# Patient Record
Sex: Male | Born: 1995 | Race: Black or African American | Hispanic: No | Marital: Single | Smoking: Current every day smoker
Health system: Southern US, Community
[De-identification: ages and names within clinical notes are randomized; demographics above are authoritative.]

## PROBLEM LIST (undated history)

## (undated) DIAGNOSIS — J45909 Unspecified asthma, uncomplicated: Secondary | ICD-10-CM

## (undated) DIAGNOSIS — J309 Allergic rhinitis, unspecified: Secondary | ICD-10-CM

## (undated) HISTORY — DX: Allergic rhinitis, unspecified: J30.9

## (undated) HISTORY — DX: Unspecified asthma, uncomplicated: J45.909

---
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
## 2017-03-21 ENCOUNTER — Encounter (HOSPITAL_BASED_OUTPATIENT_CLINIC_OR_DEPARTMENT_OTHER): Payer: Self-pay

## 2017-03-21 ENCOUNTER — Emergency Department (HOSPITAL_BASED_OUTPATIENT_CLINIC_OR_DEPARTMENT_OTHER)
Admission: EM | Admit: 2017-03-21 | Discharge: 2017-03-21 | Disposition: A | Discharge status: Home / Self Care | Payer: Self-pay | Attending: Emergency Medicine | Admitting: Emergency Medicine

## 2017-03-21 ENCOUNTER — Other Ambulatory Visit: Payer: Self-pay

## 2017-03-21 DIAGNOSIS — R059 Cough, unspecified: Secondary | ICD-10-CM

## 2017-03-21 DIAGNOSIS — Z79899 Other long term (current) drug therapy: Secondary | ICD-10-CM | POA: Insufficient documentation

## 2017-03-21 DIAGNOSIS — R112 Nausea with vomiting, unspecified: Secondary | ICD-10-CM | POA: Insufficient documentation

## 2017-03-21 DIAGNOSIS — F17210 Nicotine dependence, cigarettes, uncomplicated: Secondary | ICD-10-CM | POA: Insufficient documentation

## 2017-03-21 DIAGNOSIS — J45909 Unspecified asthma, uncomplicated: Secondary | ICD-10-CM | POA: Insufficient documentation

## 2017-03-21 DIAGNOSIS — R05 Cough: Secondary | ICD-10-CM

## 2017-03-21 MED ORDER — BENZONATATE 100 MG PO CAPS
200.0000 mg | ORAL_CAPSULE | Freq: Once | ORAL | Status: AC
Start: 2017-03-21 — End: 2017-03-21
  Administered 2017-03-21: 200 mg via ORAL
  Filled 2017-03-21: qty 2

## 2017-03-21 MED ORDER — ONDANSETRON 8 MG PO TBDP
8.0000 mg | ORAL_TABLET | Freq: Once | ORAL | Status: AC
  Administered 2017-03-21: 8 mg via ORAL
  Filled 2017-03-21: qty 1

## 2017-03-21 MED ORDER — ALBUTEROL SULFATE HFA 108 (90 BASE) MCG/ACT IN AERS: 2.0000 | INHALATION_SPRAY | Freq: Once | RESPIRATORY_TRACT | Status: DC

## 2017-03-21 MED ORDER — ONDANSETRON 8 MG PO TBDP: ORAL_TABLET | ORAL | 0 refills | Status: DC

## 2017-03-21 MED ORDER — BENZONATATE 100 MG PO CAPS: 100.0000 mg | ORAL_CAPSULE | Freq: Three times a day (TID) | ORAL | 0 refills | Status: DC

## 2017-03-21 NOTE — ED Provider Notes (Signed)
[facility] EMERGENCY DEPARTMENT Provider Note   CSN: [id number] Arrival date & time: 03/21/17  [id number]     History   Chief Complaint Chief Complaint  Patient presents with  . Cough    HPI Hunter Burke is a 22 y.o. male.  The history is provided by the patient.  Cough  This is a recurrent problem. The current episode started yesterday. The problem occurs every few hours. The problem has not changed since onset.The cough is non-productive. There has been no fever. Pertinent negatives include no chest pain, no chills, no sore throat, no myalgias, no shortness of breath and no wheezing. He has tried nothing for the symptoms. The treatment provided no relief. His past medical history does not include pneumonia.  Also has nausea and vomiting.    Past Medical History:  Diagnosis Date  . Allergic rhinitis   . Asthma     Patient Active Problem List   Diagnosis Date Noted  . Mild persistent asthma 07/04/2016  . Allergic cough 07/04/2016  . Seasonal and perennial allergic rhinoconjunctivitis 07/04/2016  . Seasonal allergic conjunctivitis 07/04/2016    History reviewed. No pertinent surgical history.     Home Medications    Prior to Admission medications   Medication Sig Start Date End Date Taking? Authorizing Provider  cetirizine (ZYRTEC) 10 MG tablet Take 1 tablet (10 mg total) by mouth daily as needed for allergies. 07/04/16  Yes Bobbitt, Heywood Ilesalph Carter, MD  EPINEPHrine (EPIPEN 2-PAK) 0.3 mg/0.3 mL IJ SOAJ injection Use as directed for severe allergic reactions 07/04/16  Yes Bobbitt, Heywood Ilesalph Carter, MD  fluticasone [facility](FLONASE) 50 MCG/ACT nasal spray Place 2 sprays into both nostrils daily as needed for allergies or rhinitis. 07/04/16  Yes Bobbitt, Heywood Ilesalph Carter, MD  fluticasone (FLOVENT HFA) 110 MCG/ACT inhaler Inhale 2 puffs into the lungs 2 (two) times daily. Patient taking differently: Inhale 2 puffs into the lungs at bedtime.  07/04/16  Yes Bobbitt, Heywood Ilesalph Carter, MD    NON FORMULARY 2 injections weekly   Yes [provider]  olopatadine (PATANOL) 0.1 % ophthalmic solution Place 1 drop into both eyes 2 (two) times daily as needed for allergies. 07/04/16  Yes Bobbitt, Heywood Ilesalph Carter, MD  albuterol (PROVENTIL HFA) 108 (90 Base) MCG/ACT inhaler Inhale 2 puffs into the lungs every 4 (four) hours as needed for wheezing or shortness of breath. 07/04/16   Bobbitt, Heywood Ilesalph Carter, MD  benzonatate (TESSALON) 100 MG capsule Take 1 capsule (100 mg total) by mouth every 8 (eight) hours. 03/21/17   Deyton Ellenbecker, MD  ondansetron (ZOFRAN ODT) 8 MG disintegrating tablet 8mg  ODT q12  hours prn nausea 03/21/17   Rayyan Orsborn, MD    Family History Family History  Problem Relation Age of Onset  . Allergic rhinitis Sister   . Eczema Sister   . Asthma Paternal Uncle   . Urticaria Neg Hx   . Immunodeficiency Neg Hx   . Angioedema Neg Hx   . Atopy Neg Hx     Social History Social History   Tobacco Use  . Smoking status: Current Every Day Smoker    Types: Cigarettes  . Smokeless tobacco: Never Used  . Tobacco comment: half of one cig a day, black and mild  Substance Use Topics  . Alcohol use: No  . Drug use: Yes    Types: Marijuana     Allergies   Patient has no known allergies.   Review of Systems Review of Systems  Constitutional: Negative for chills and  fever.  HENT: Positive for congestion. Negative for ear discharge, facial swelling, sore throat and trouble swallowing.   Eyes: Negative for photophobia.  Respiratory: Positive for cough. Negative for shortness of breath and wheezing.   Cardiovascular: Negative for chest pain.  Gastrointestinal: Positive for nausea and vomiting. Negative for abdominal pain.  Genitourinary: Negative for flank pain.  Musculoskeletal: Negative for myalgias.  Neurological: Negative for weakness.  All other systems reviewed and are negative.    Physical Exam Updated Vital Signs BP 111/83 (BP Location: Right Arm)    Pulse 82   Temp 97.6 F (36.4 C) (Oral)   Resp 18   Ht 5\' 9"  (1.753 m)   Wt 65.8 kg (145 lb)   SpO2 99%   BMI 21.41 kg/m   Physical Exam  Constitutional: He is oriented to person, place, and time. He appears well-developed and well-nourished. No distress.  HENT:  Head: Normocephalic and atraumatic.  Nose: Nose normal.  Mouth/Throat: Oropharynx is clear and moist. No oropharyngeal exudate.  Eyes: Conjunctivae are normal. Pupils are equal, round, and reactive to light.  Neck: Normal range of motion. Neck supple.  Cardiovascular: Normal rate, regular rhythm, normal heart sounds and intact distal pulses.  Pulmonary/Chest: Effort normal and breath sounds normal. No stridor. No respiratory distress. He has no wheezes. He has no rales.  Abdominal: Soft. Bowel sounds are normal. He exhibits no mass. There is no tenderness. There is no rebound and no guarding. No hernia.  Musculoskeletal: Normal range of motion.  Neurological: He is alert and oriented to person, place, and time. He displays normal reflexes.  Skin: Skin is warm and dry. Capillary refill takes less than 2 seconds.  Psychiatric: He has a normal mood and affect.     ED Treatments / Results   Vitals:   03/21/17 0426  BP: 111/83  Pulse: 82  Resp: 18  Temp: 97.6 F (36.4 C)  SpO2: 99%    Procedures Procedures (including critical care time)  Medications Ordered in ED Medications  ondansetron (ZOFRAN-ODT) disintegrating tablet 8 mg (8 mg Oral Given 03/21/17 0433)  benzonatate (TESSALON) capsule 200 mg (200 mg Oral Given 03/21/17 0433)       Final Clinical Impressions(s) / ED Diagnoses   Final diagnoses:  Non-intractable vomiting with nausea, unspecified vomiting type  Cough   Return for weakness, numbness, changes in vision or speech, fevers >100.4 unrelieved by medication, shortness of breath, intractable vomiting, or diarrhea, abdominal pain, Inability to tolerate liquids or food, cough, altered mental  status or any concerns. No signs of systemic illness or infection. The patient is nontoxic-appearing on exam and vital signs are within normal limits.   I have reviewed the triage vital signs and the nursing notes. Pertinent labs &imaging results that were available during my care of the patient were reviewed by me and considered in my medical decision making (see chart for details).  After history, exam, and medical workup I feel the patient has been appropriately medically screened and is safe for discharge home. Pertinent diagnoses were discussed with the patient. Patient was given return precautions.  ED Discharge Orders        Ordered    ondansetron (ZOFRAN ODT) 8 MG disintegrating tablet     03/21/17 0438    benzonatate (TESSALON) 100 MG capsule  Every 8 hours     03/21/17 0438       Ramello Cordial, MD 03/21/17 0440

## 2017-03-21 NOTE — ED Notes (Signed)
ED Provider at bedside. 

## 2017-03-21 NOTE — ED Triage Notes (Signed)
Pt presents from home with a cough and post-tussive emesis.

## 2018-03-03 IMAGING — CR DG CHEST 2V
2 series · 2 of 2 positions shown · non-contrast
Comparison: None.

CLINICAL DATA: Cough, vomiting and chest pain today

EXAM:
CHEST  2 VIEW

[w chest pa]
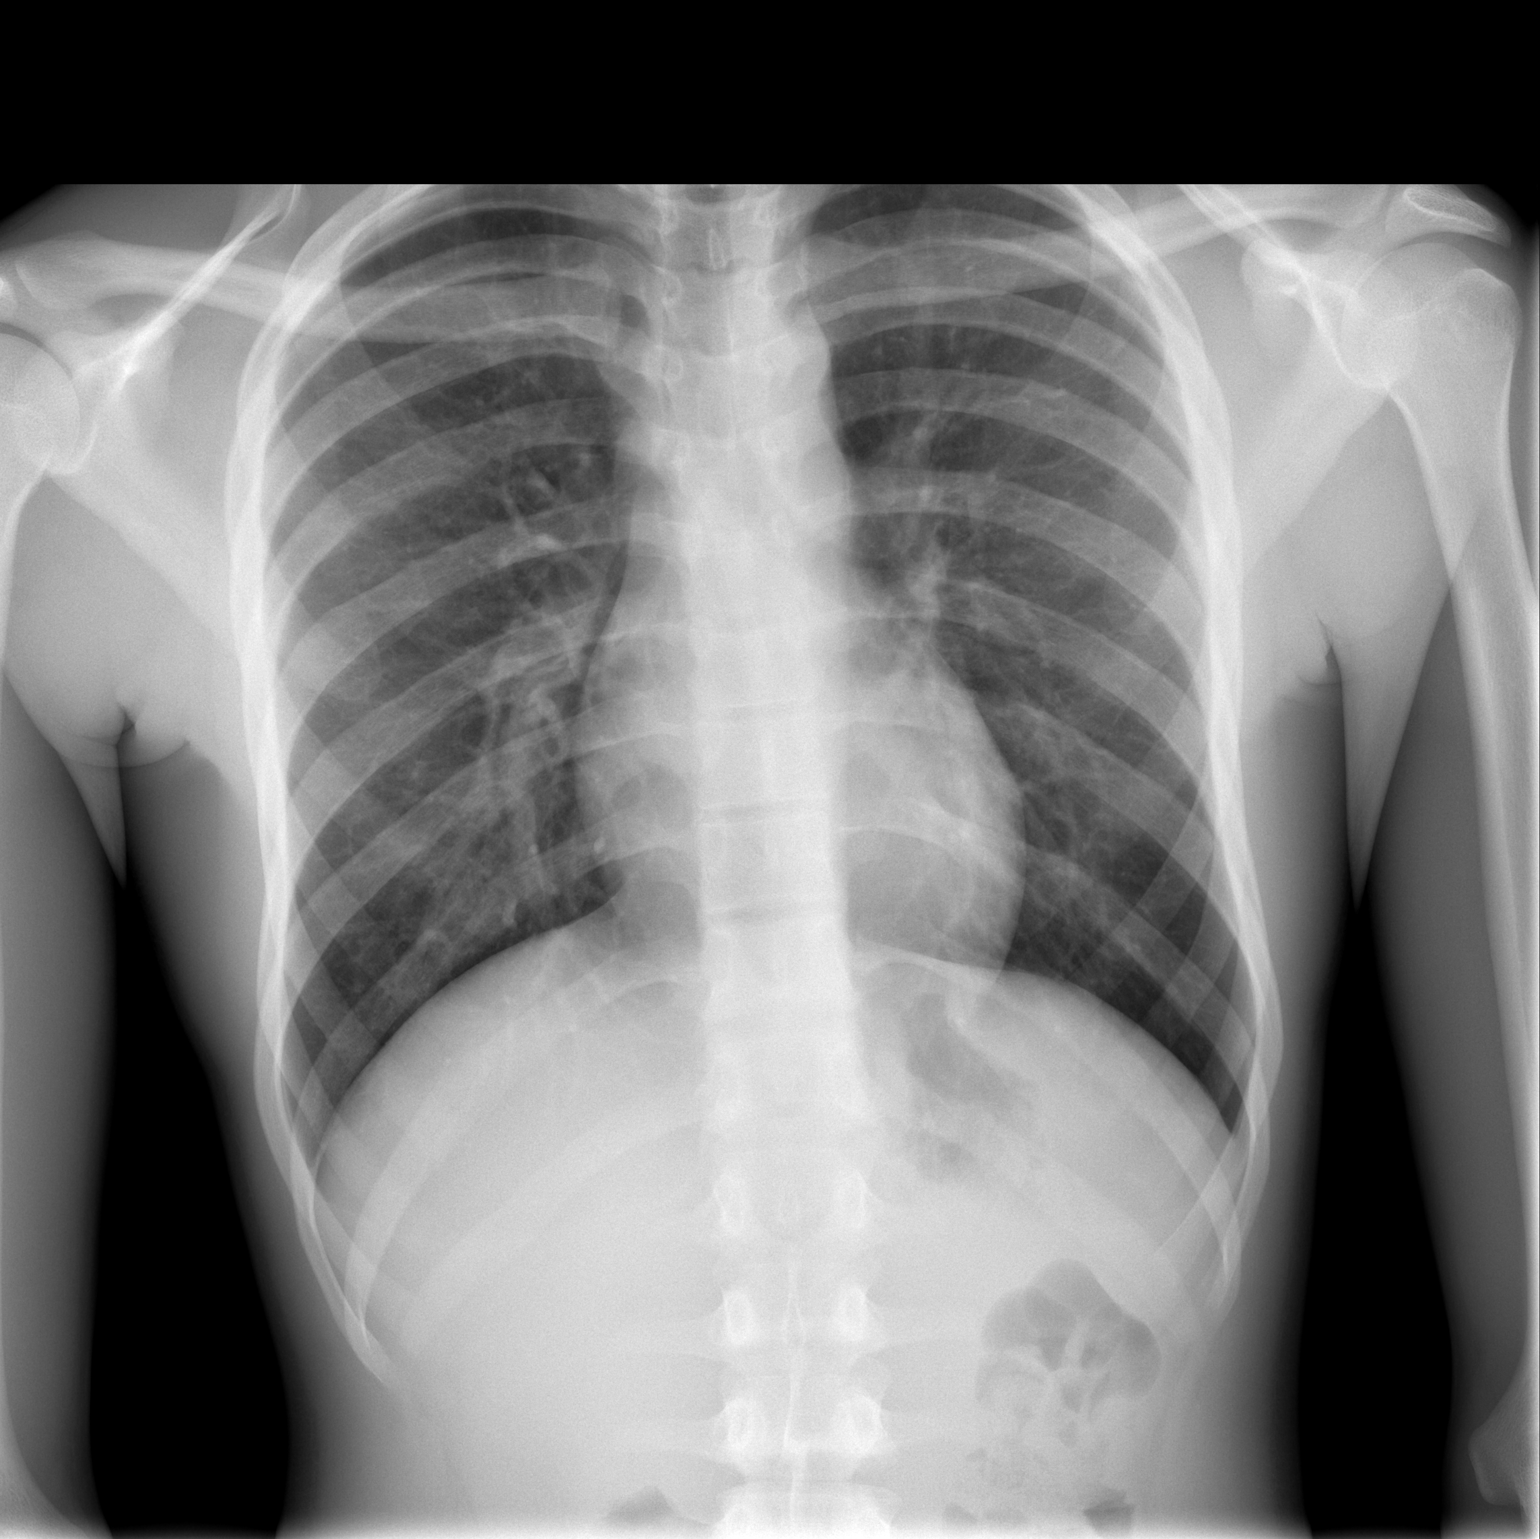

[w chest lat]
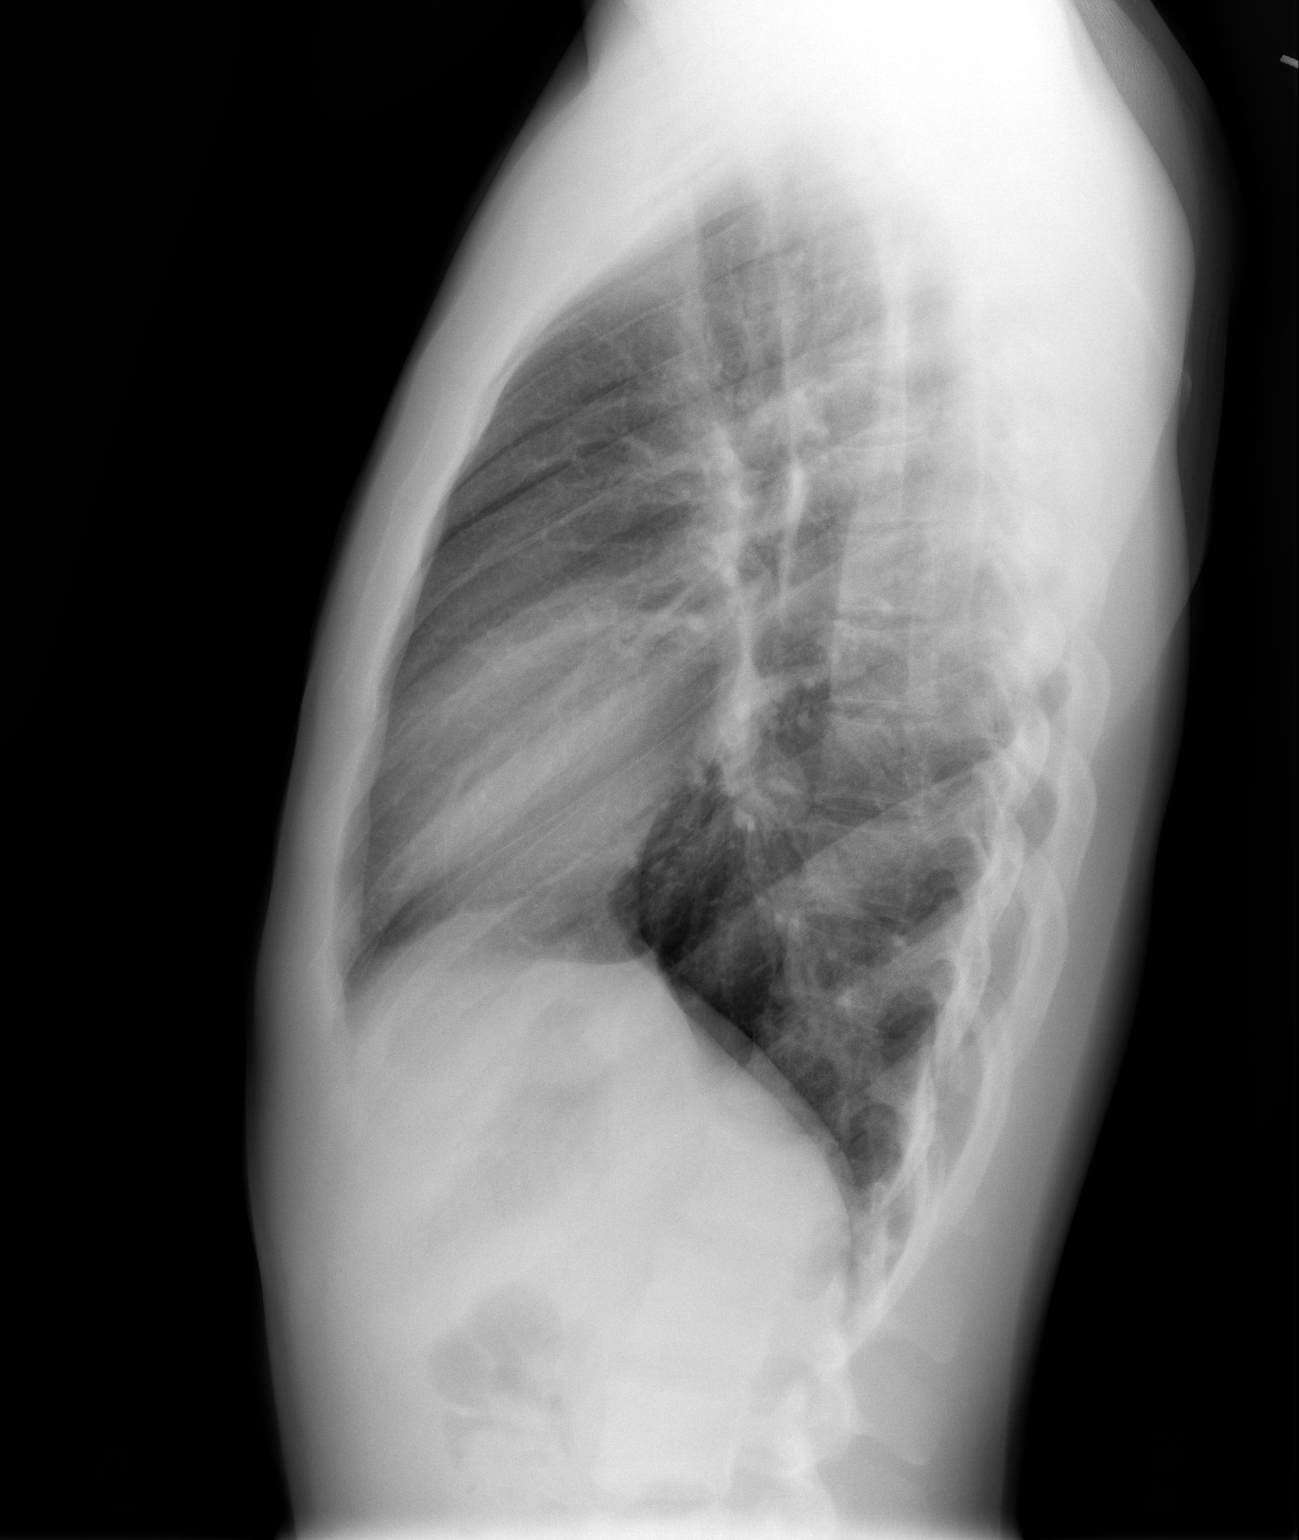

[2 of 2 positions shown; findings below may reference images not displayed]

FINDINGS: The heart size and mediastinal contours are within normal limits.
Both lungs are clear. The visualized skeletal structures are
unremarkable.
IMPRESSION: No active cardiopulmonary disease.

## 2020-02-15 ENCOUNTER — Other Ambulatory Visit: Payer: Self-pay

## 2020-02-15 ENCOUNTER — Emergency Department (HOSPITAL_BASED_OUTPATIENT_CLINIC_OR_DEPARTMENT_OTHER)
Admission: EM | Admit: 2020-02-15 | Discharge: 2020-02-15 | Disposition: A | Discharge status: Home / Self Care | Payer: HRSA Program | Attending: Emergency Medicine | Admitting: Emergency Medicine

## 2020-02-15 DIAGNOSIS — Z20822 Contact with and (suspected) exposure to covid-19: Secondary | ICD-10-CM | POA: Diagnosis not present

## 2020-02-15 DIAGNOSIS — F17210 Nicotine dependence, cigarettes, uncomplicated: Secondary | ICD-10-CM | POA: Diagnosis not present

## 2020-02-15 DIAGNOSIS — R0981 Nasal congestion: Secondary | ICD-10-CM | POA: Diagnosis present

## 2020-02-15 DIAGNOSIS — R112 Nausea with vomiting, unspecified: Secondary | ICD-10-CM | POA: Diagnosis not present

## 2020-02-15 DIAGNOSIS — J4530 Mild persistent asthma, uncomplicated: Secondary | ICD-10-CM | POA: Diagnosis not present

## 2020-02-15 DIAGNOSIS — R070 Pain in throat: Secondary | ICD-10-CM | POA: Diagnosis not present

## 2020-02-15 DIAGNOSIS — R059 Cough, unspecified: Secondary | ICD-10-CM | POA: Diagnosis not present

## 2020-02-15 LAB — SARS CORONAVIRUS 2 (TAT 6-24 HRS): SARS Coronavirus 2: NEGATIVE

## 2020-02-15 MED ORDER — ONDANSETRON 4 MG PO TBDP: 4.0000 mg | ORAL_TABLET | Freq: Three times a day (TID) | ORAL | 1 refills | Status: DC | PRN

## 2020-02-15 MED ORDER — IBUPROFEN 800 MG PO TABS: 800.0000 mg | ORAL_TABLET | Freq: Three times a day (TID) | ORAL | 0 refills | Status: DC

## 2020-02-15 NOTE — ED Provider Notes (Signed)
[facility] EMERGENCY DEPARTMENT Provider Note   CSN: [id number] Arrival date & time: 02/15/20  7014     History No chief complaint on file.   Hunter Burke is a 25 y.o. male.  Patient with upper respiratory symptoms and viral symptoms that started on Wednesday, February 2.  Associated with congestion sore throat some nausea and vomiting but no vomiting since Saturday.  Patient has not been tested for Covid infection.  Patient has not had any the vaccines.  Patient also has a cough but no shortness of breath oxygen saturation here is 98% on room air respiratory rate is 18 temp here was 99.9.         Past Medical History:  Diagnosis Date  . Allergic rhinitis   . Asthma     Patient Active Problem List   Diagnosis Date Noted  . Mild persistent asthma 07/04/2016  . Allergic cough 07/04/2016  . Seasonal and perennial allergic rhinoconjunctivitis 07/04/2016  . Seasonal allergic conjunctivitis 07/04/2016    No past surgical history on file.     Family History  Problem Relation Age of Onset  . Allergic rhinitis Sister   . Eczema Sister   . Asthma Paternal Uncle   . Urticaria Neg Hx   . Immunodeficiency Neg Hx   . Angioedema Neg Hx   . Atopy Neg Hx     Social History   Tobacco Use  . Smoking status: Current Every Day Smoker    Types: Cigarettes  . Smokeless tobacco: Never Used  . Tobacco comment: half of one cig a day, black and mild  Vaping Use  . Vaping Use: Never used  Substance Use Topics  . Alcohol use: No  . Drug use: Yes    Types: Marijuana    Home Medications Prior to Admission medications   Medication Sig Start Date End Date Taking? Authorizing Provider  ibuprofen (ADVIL) 800 MG tablet Take 1 tablet (800 mg total) by mouth 3 (three) times daily. 02/15/20  Yes Vanetta Mulders, MD  ondansetron (ZOFRAN ODT) 4 MG disintegrating tablet Take 1 tablet (4 mg total) by mouth every 8 (eight) hours as needed. 02/15/20  Yes Vanetta Mulders, MD   albuterol (PROVENTIL HFA) 108 (90 Base) MCG/ACT inhaler Inhale 2 puffs into the lungs every 4 (four) hours as needed for wheezing or shortness of breath. 07/04/16   Bobbitt, Heywood Iles, MD  benzonatate (TESSALON) 100 MG capsule Take 1 capsule (100 mg total) by mouth every 8 (eight) hours. 03/21/17   Palumbo, April, MD  cetirizine (ZYRTEC) 10 MG tablet Take 1 tablet (10 mg total) by mouth daily as needed for allergies. 07/04/16   Bobbitt, Heywood Iles, MD  EPINEPHrine (EPIPEN 2-PAK) 0.3 mg/0.3 mL IJ SOAJ injection Use as directed for severe allergic reactions 07/04/16   Bobbitt, Heywood Iles, MD  fluticasone [facility]) 50 MCG/ACT nasal spray Place 2 sprays into both nostrils daily as needed for allergies or rhinitis. 07/04/16   Bobbitt, Heywood Iles, MD  fluticasone (FLOVENT HFA) 110 MCG/ACT inhaler Inhale 2 puffs into the lungs 2 (two) times daily. Patient taking differently: Inhale 2 puffs into the lungs at bedtime.  07/04/16   Bobbitt, Heywood Iles, MD  NON FORMULARY 2 injections weekly    [provider]  olopatadine (PATANOL) 0.1 % ophthalmic solution Place 1 drop into both eyes 2 (two) times daily as needed for allergies. 07/04/16   Bobbitt, Heywood Iles, MD  ondansetron (ZOFRAN ODT) 8 MG disintegrating tablet 8mg  ODT q12  hours  prn nausea 03/21/17   Palumbo, April, MD    Allergies    Patient has no known allergies.  Review of Systems   Review of Systems  Constitutional: Negative for chills and fever.  HENT: Positive for congestion and sore throat. Negative for rhinorrhea.   Eyes: Negative for visual disturbance.  Respiratory: Positive for cough. Negative for shortness of breath.   Cardiovascular: Negative for chest pain and leg swelling.  Gastrointestinal: Positive for nausea and vomiting. Negative for abdominal pain and diarrhea.  Genitourinary: Negative for dysuria.  Musculoskeletal: Negative for back pain and neck pain.  Skin: Negative for rash.  Neurological: Negative for  dizziness, light-headedness and headaches.  Hematological: Does not bruise/bleed easily.  Psychiatric/Behavioral: Negative for confusion.    Physical Exam Updated Vital Signs BP (!) 150/96 (BP Location: Right Arm)   Pulse 77   Temp 99.2 F (37.3 C) (Oral)   Resp 18   Ht 1.753 m (5\' 9" )   Wt 63.5 kg   SpO2 100%   BMI 20.67 kg/m   Physical Exam Vitals and nursing note reviewed.  Constitutional:      General: He is not in acute distress.    Appearance: Normal appearance. He is well-developed and well-nourished.  HENT:     Head: Normocephalic and atraumatic.     Mouth/Throat:     Pharynx: Oropharynx is clear. Posterior oropharyngeal erythema present. No oropharyngeal exudate.     Comments: Erythema to the posterior pharynx no tonsillar enlargement no exudate.  Uvula midline Eyes:     Extraocular Movements: Extraocular movements intact.     Conjunctiva/sclera: Conjunctivae normal.     Pupils: Pupils are equal, round, and reactive to light.  Cardiovascular:     Rate and Rhythm: Normal rate and regular rhythm.     Heart sounds: No murmur heard.   Pulmonary:     Effort: Pulmonary effort is normal. No respiratory distress.     Breath sounds: Normal breath sounds. No wheezing.  Abdominal:     Palpations: Abdomen is soft.     Tenderness: There is no abdominal tenderness.  Musculoskeletal:        General: No edema. Normal range of motion.     Cervical back: Neck supple. No rigidity.  Skin:    General: Skin is warm and dry.     Capillary Refill: Capillary refill takes less than 2 seconds.  Neurological:     General: No focal deficit present.     Mental Status: He is alert and oriented to person, place, and time.     Cranial Nerves: No cranial nerve deficit.     Sensory: No sensory deficit.  Psychiatric:        Mood and Affect: Mood and affect normal.     ED Results / Procedures / Treatments   Labs (all labs ordered are listed, but only abnormal results are  displayed) Labs Reviewed  SARS CORONAVIRUS 2 (TAT 6-24 HRS)    EKG None  Radiology No results found.  Procedures Procedures   Medications Ordered in ED Medications - No data to display  ED Course  I have reviewed the triage vital signs and the nursing notes.  Pertinent labs & imaging results that were available during my care of the patient were reviewed by me and considered in my medical decision making (see chart for details).    MDM Rules/Calculators/A&P  Suspect Covid infection based on his symptoms.  Patient not vaccinated.  Denies been tested.  We will will do testing here he should have results on MyChart by tomorrow.  Will treat symptomatically.  Patient nontoxic no acute distress.  Work note provided      Final Clinical Impression(s) / ED Diagnoses Final diagnoses:  Suspected COVID-19 virus infection    Rx / DC Orders ED Discharge Orders         Ordered    ondansetron (ZOFRAN ODT) 4 MG disintegrating tablet  Every 8 hours PRN        02/15/20 0808    ibuprofen (ADVIL) 800 MG tablet  3 times daily        02/15/20 9476           Vanetta Mulders, MD 02/15/20 [phone]

## 2020-02-15 NOTE — Discharge Instructions (Signed)
Suspect that you have Covid infection.  Testing done today should have results tomorrow.  Followed up on MyChart.  Have sent prescriptions to the [facility] in [facility] for full strength Motrin to help with the throat pain.  And Zofran to help with the nausea and vomiting.  You can also take over-the-counter cold and cough medicines.

## 2020-06-24 ENCOUNTER — Encounter (HOSPITAL_BASED_OUTPATIENT_CLINIC_OR_DEPARTMENT_OTHER): Payer: Self-pay

## 2020-06-24 ENCOUNTER — Emergency Department (HOSPITAL_BASED_OUTPATIENT_CLINIC_OR_DEPARTMENT_OTHER)
Admission: EM | Admit: 2020-06-24 | Discharge: 2020-06-24 | Disposition: A | Discharge status: Home / Self Care | Payer: Medicaid Other | Attending: Emergency Medicine | Admitting: Emergency Medicine

## 2020-06-24 ENCOUNTER — Other Ambulatory Visit: Payer: Self-pay

## 2020-06-24 DIAGNOSIS — R059 Cough, unspecified: Secondary | ICD-10-CM | POA: Insufficient documentation

## 2020-06-24 DIAGNOSIS — F17210 Nicotine dependence, cigarettes, uncomplicated: Secondary | ICD-10-CM | POA: Insufficient documentation

## 2020-06-24 DIAGNOSIS — Z20822 Contact with and (suspected) exposure to covid-19: Secondary | ICD-10-CM | POA: Insufficient documentation

## 2020-06-24 DIAGNOSIS — J45909 Unspecified asthma, uncomplicated: Secondary | ICD-10-CM | POA: Insufficient documentation

## 2020-06-24 DIAGNOSIS — R112 Nausea with vomiting, unspecified: Secondary | ICD-10-CM | POA: Insufficient documentation

## 2020-06-24 DIAGNOSIS — Z7951 Long term (current) use of inhaled steroids: Secondary | ICD-10-CM | POA: Insufficient documentation

## 2020-06-24 LAB — CBC WITH DIFFERENTIAL/PLATELET
Abs Immature Granulocytes: 0.02 10*3/uL (ref 0.00–0.07)
Basophils Absolute: 0 10*3/uL (ref 0.0–0.1)
Basophils Relative: 0 %
Eosinophils Absolute: 0.3 10*3/uL (ref 0.0–0.5)
Eosinophils Relative: 4 %
HCT: 42.1 % (ref 39.0–52.0)
Hemoglobin: 15 g/dL (ref 13.0–17.0)
Immature Granulocytes: 0 %
Lymphocytes Relative: 26 %
Lymphs Abs: 1.9 10*3/uL (ref 0.7–4.0)
MCH: 30.7 pg (ref 26.0–34.0)
MCHC: 35.6 g/dL (ref 30.0–36.0)
MCV: 86.3 fL (ref 80.0–100.0)
Monocytes Absolute: 0.5 10*3/uL (ref 0.1–1.0)
Monocytes Relative: 7 %
Neutro Abs: 4.5 10*3/uL (ref 1.7–7.7)
Neutrophils Relative %: 63 %
Platelets: 238 10*3/uL (ref 150–400)
RBC: 4.88 MIL/uL (ref 4.22–5.81)
RDW: 13.1 % (ref 11.5–15.5)
WBC: 7.2 10*3/uL (ref 4.0–10.5)
nRBC: 0 % (ref 0.0–0.2)

## 2020-06-24 LAB — BASIC METABOLIC PANEL
Anion gap: 8 (ref 5–15)
BUN: 17 mg/dL (ref 6–20)
CO2: 27 mmol/L (ref 22–32)
Calcium: 9.2 mg/dL (ref 8.9–10.3)
Chloride: 102 mmol/L (ref 98–111)
Creatinine, Ser: 1.28 mg/dL — ABNORMAL HIGH (ref 0.61–1.24)
GFR, Estimated: >60 mL/min (ref >60)
Glucose, Bld: 95 mg/dL (ref 70–99)
Potassium: 3.4 mmol/L — ABNORMAL LOW (ref 3.5–5.1)
Sodium: 137 mmol/L (ref 135–145)

## 2020-06-24 LAB — SARS CORONAVIRUS 2 (TAT 6-24 HRS): SARS Coronavirus 2: NEGATIVE

## 2020-06-24 MED ORDER — ONDANSETRON HCL 4 MG/2ML IJ SOLN
4.0000 mg | Freq: Once | INTRAMUSCULAR | Status: AC
  Administered 2020-06-24: 4 mg via INTRAVENOUS
  Filled 2020-06-24: qty 2

## 2020-06-24 MED ORDER — LACTATED RINGERS IV BOLUS
1000.0000 mL | Freq: Once | INTRAVENOUS | Status: AC
  Administered 2020-06-24: 1000 mL via INTRAVENOUS

## 2020-06-24 MED ORDER — METOCLOPRAMIDE HCL 5 MG/ML IJ SOLN
10.0000 mg | Freq: Once | INTRAMUSCULAR | Status: AC | PRN
  Administered 2020-06-24: 10 mg via INTRAVENOUS
  Filled 2020-06-24: qty 2

## 2020-06-24 MED ORDER — METOCLOPRAMIDE HCL 10 MG PO TABS: 10.0000 mg | ORAL_TABLET | Freq: Four times a day (QID) | ORAL | 0 refills | Status: AC | PRN

## 2020-06-24 MED ORDER — BENZONATATE 100 MG PO CAPS: 100.0000 mg | ORAL_CAPSULE | Freq: Three times a day (TID) | ORAL | 0 refills | Status: AC | PRN

## 2020-06-24 NOTE — ED Triage Notes (Signed)
Pt reports emesis x2 hours after eating. Denies abd pain or diarrhea.

## 2020-06-24 NOTE — ED Notes (Signed)
Gave patient gingerale for po challenge.  

## 2020-06-24 NOTE — ED Provider Notes (Signed)
[facility] Provider Note: Lowella Dell, MD, FACEP  CSN: [id number] MRN: [id number] ARRIVAL: 06/24/20 at 0048 ROOM: MH10/[facility]10   CHIEF COMPLAINT  Vomiting   HISTORY OF PRESENT ILLNESS  06/24/20 1:24 AM Hunter Burke is a 25 y.o. male who has had nausea and vomiting since yesterday morning about 11.  He estimates he has vomited at least 5 times.  Symptoms are worse if he attempts to eat or drink anything.  He has had no associated abdominal pain or diarrhea.  He has not had a fever but has had a cough.   Past Medical History:  Diagnosis Date   Allergic rhinitis    Asthma     History reviewed. No pertinent surgical history.  Family History  Problem Relation Age of Onset   Allergic rhinitis Sister    Eczema Sister    Asthma Paternal Uncle    Urticaria Neg Hx    Immunodeficiency Neg Hx    Angioedema Neg Hx    Atopy Neg Hx     Social History   Tobacco Use   Smoking status: Every Day    Pack years: 0.00    Types: Cigarettes   Smokeless tobacco: Never   Tobacco comments:    half of one cig a day, black and mild  Vaping Use   Vaping Use: Never used  Substance Use Topics   Alcohol use: No   Drug use: Yes    Types: Marijuana    Prior to Admission medications   Medication Sig Start Date End Date Taking? Authorizing Provider  metoCLOPramide (REGLAN) 10 MG tablet Take 1 tablet (10 mg total) by mouth every 6 (six) hours as needed for nausea or vomiting (nausea/headache). 06/24/20  Yes Anthonella Klausner, MD  albuterol (PROVENTIL HFA) 108 (90 Base) MCG/ACT inhaler Inhale 2 puffs into the lungs every 4 (four) hours as needed for wheezing or shortness of breath. 07/04/16   Bobbitt, Heywood Iles, MD  cetirizine (ZYRTEC) 10 MG tablet Take 1 tablet (10 mg total) by mouth daily as needed for allergies. 07/04/16   Bobbitt, Heywood Iles, MD  EPINEPHrine (EPIPEN 2-PAK) 0.3 mg/0.3 mL IJ SOAJ injection Use as directed for severe allergic reactions 07/04/16   Bobbitt, Heywood Iles, MD  fluticasone [facility]) 50 MCG/ACT nasal spray Place 2 sprays into both nostrils daily as needed for allergies or rhinitis. 07/04/16   Bobbitt, Heywood Iles, MD  fluticasone (FLOVENT HFA) 110 MCG/ACT inhaler Inhale 2 puffs into the lungs 2 (two) times daily. Patient taking differently: Inhale 2 puffs into the lungs at bedtime.  07/04/16   Bobbitt, Heywood Iles, MD  NON FORMULARY 2 injections weekly    [provider]  olopatadine (PATANOL) 0.1 % ophthalmic solution Place 1 drop into both eyes 2 (two) times daily as needed for allergies. 07/04/16   Bobbitt, Heywood Iles, MD    Allergies Patient has no known allergies.   REVIEW OF SYSTEMS  Negative except as noted here or in the History of Present Illness.   PHYSICAL EXAMINATION  Initial Vital Signs Blood pressure (!) 142/80, pulse 63, temperature 97.9 F (36.6 C), temperature source Oral, resp. rate 20, height 5\' 9"  (1.753 m), weight 67 kg, SpO2 95 %.  Examination General: Well-developed, well-nourished male in no acute distress; appearance consistent with age of record HENT: normocephalic; atraumatic Eyes: pupils equal, round and reactive to light; extraocular muscles intact Neck: supple Heart: regular rate and rhythm Lungs: clear to auscultation bilaterally Abdomen: soft; nondistended; nontender; bowel sounds  present Extremities: No deformity; full range of motion; pulses normal Neurologic: Awake, alert and oriented; motor function intact in all extremities and symmetric; no facial droop Skin: Warm and dry Psychiatric: Flat affect   RESULTS  Summary of this visit's results, reviewed and interpreted by myself:   EKG Interpretation  Date/Time:    Ventricular Rate:    PR Interval:    QRS Duration:   QT Interval:    QTC Calculation:   R Axis:     Text Interpretation:          Laboratory Studies: Results for orders placed or performed during the hospital encounter of 06/24/20 (from the past 24  hour(s))  CBC with Differential/Platelet     Status: None   Collection Time: 06/24/20  2:29 AM  Result Value Ref Range   WBC 7.2 4.0 - 10.5 K/uL   RBC 4.88 4.22 - 5.81 MIL/uL   Hemoglobin 15.0 13.0 - 17.0 g/dL   HCT 53.2 99.2 - 42.6 %   MCV 86.3 80.0 - 100.0 fL   MCH 30.7 26.0 - 34.0 pg   MCHC 35.6 30.0 - 36.0 g/dL   RDW 83.4 19.6 - 22.2 %   Platelets 238 150 - 400 K/uL   nRBC 0.0 0.0 - 0.2 %   Neutrophils Relative % 63 %   Neutro Abs 4.5 1.7 - 7.7 K/uL   Lymphocytes Relative 26 %   Lymphs Abs 1.9 0.7 - 4.0 K/uL   Monocytes Relative 7 %   Monocytes Absolute 0.5 0.1 - 1.0 K/uL   Eosinophils Relative 4 %   Eosinophils Absolute 0.3 0.0 - 0.5 K/uL   Basophils Relative 0 %   Basophils Absolute 0.0 0.0 - 0.1 K/uL   Immature Granulocytes 0 %   Abs Immature Granulocytes 0.02 0.00 - 0.07 K/uL  Basic metabolic panel     Status: Abnormal   Collection Time: 06/24/20  2:29 AM  Result Value Ref Range   Sodium 137 135 - 145 mmol/L   Potassium 3.4 (L) 3.5 - 5.1 mmol/L   Chloride 102 98 - 111 mmol/L   CO2 27 22 - 32 mmol/L   Glucose, Bld 95 70 - 99 mg/dL   BUN 17 6 - 20 mg/dL   Creatinine, Ser 9.79 (H) 0.61 - 1.24 mg/dL   Calcium 9.2 8.9 - 89.2 mg/dL   GFR, Estimated >11 >94 mL/min   Anion gap 8 5 - 15   Imaging Studies: No results found.  ED COURSE and MDM  Nursing notes, initial and subsequent vitals signs, including pulse oximetry, reviewed and interpreted by myself.  Vitals:   06/24/20 0053 06/24/20 0055  BP: (!) 142/80   Pulse: 63   Resp: 20   Temp: 97.9 F (36.6 C)   TempSrc: Oral   SpO2: 95%   Weight:  67 kg  Height:  5\' 9"  (1.753 m)   Medications  lactated ringers bolus 1,000 mL (1,000 mLs Intravenous New Bag/Given 06/24/20 0145)  ondansetron (ZOFRAN) injection 4 mg (4 mg Intravenous Given 06/24/20 0145)  metoCLOPramide (REGLAN) injection 10 mg (10 mg Intravenous Given 06/24/20 0201)   4:13 AM No further vomiting after Reglan.  Patient is drinking ginger ale  without emesis.   PROCEDURES  Procedures   ED DIAGNOSES     ICD-10-CM   1. Nausea and vomiting in adult  R11.2          Fredrico Beedle, 04-04-1987, MD 06/24/20 [phone]

## 2020-10-25 ENCOUNTER — Emergency Department (HOSPITAL_BASED_OUTPATIENT_CLINIC_OR_DEPARTMENT_OTHER)
Admission: EM | Admit: 2020-10-25 | Discharge: 2020-10-25 | Disposition: A | Discharge status: Home / Self Care | Payer: Medicaid Other | Attending: Emergency Medicine | Admitting: Emergency Medicine

## 2020-10-25 ENCOUNTER — Encounter (HOSPITAL_BASED_OUTPATIENT_CLINIC_OR_DEPARTMENT_OTHER): Payer: Self-pay | Admitting: *Deleted

## 2020-10-25 ENCOUNTER — Other Ambulatory Visit: Payer: Self-pay

## 2020-10-25 DIAGNOSIS — R112 Nausea with vomiting, unspecified: Secondary | ICD-10-CM | POA: Insufficient documentation

## 2020-10-25 DIAGNOSIS — J069 Acute upper respiratory infection, unspecified: Secondary | ICD-10-CM | POA: Insufficient documentation

## 2020-10-25 DIAGNOSIS — R638 Other symptoms and signs concerning food and fluid intake: Secondary | ICD-10-CM | POA: Insufficient documentation

## 2020-10-25 DIAGNOSIS — F17210 Nicotine dependence, cigarettes, uncomplicated: Secondary | ICD-10-CM | POA: Insufficient documentation

## 2020-10-25 DIAGNOSIS — J3489 Other specified disorders of nose and nasal sinuses: Secondary | ICD-10-CM | POA: Insufficient documentation

## 2020-10-25 DIAGNOSIS — Z20822 Contact with and (suspected) exposure to covid-19: Secondary | ICD-10-CM | POA: Insufficient documentation

## 2020-10-25 DIAGNOSIS — Z7951 Long term (current) use of inhaled steroids: Secondary | ICD-10-CM | POA: Insufficient documentation

## 2020-10-25 DIAGNOSIS — J4530 Mild persistent asthma, uncomplicated: Secondary | ICD-10-CM | POA: Insufficient documentation

## 2020-10-25 LAB — RESP PANEL BY RT-PCR (FLU A&B, COVID) ARPGX2
Influenza A by PCR: NEGATIVE
Influenza B by PCR: NEGATIVE
SARS Coronavirus 2 by RT PCR: NEGATIVE

## 2020-10-25 MED ORDER — ONDANSETRON 4 MG PO TBDP
4.0000 mg | ORAL_TABLET | Freq: Once | ORAL | Status: AC
  Administered 2020-10-25: 4 mg via ORAL
  Filled 2020-10-25: qty 1

## 2020-10-25 MED ORDER — ONDANSETRON 4 MG PO TBDP: 4.0000 mg | ORAL_TABLET | Freq: Three times a day (TID) | ORAL | 0 refills | Status: AC | PRN

## 2020-10-25 NOTE — ED Notes (Signed)
No acute distress noted upon this RN's departure of patient. Verified discharge paperwork with name and DOB. Vital signs stable. Patient taken to checkout window. Discharge paperwork discussed with patient. No further questions voiced upon discharge.  ° °

## 2020-10-25 NOTE — Discharge Instructions (Signed)
Take Zofran as needed as prescribed for nausea and vomiting. Delsym as needed as directed for cough. Recheck with your doctor as needed, return for any concerns.

## 2020-10-25 NOTE — ED Provider Notes (Signed)
[facility] EMERGENCY DEPARTMENT Provider Note   CSN: [id number] Arrival date & time: 10/25/20  1809     History Chief Complaint  Patient presents with   Emesis    Hunter Burke is a 25 y.o. male.  25 year old male presents with complaint of cough and vomiting, onset today. Reports 2-3 episodes of emesis this afternoon, ate [place] food last night, no known sick contacts. Denies fevers, chills. Reports decreased PO intake/appetite secondary to vomiting. Tried theraflu which resulted in more vomiting. Denies abdominal pain, changes in bowel or baldder habits, fevers. No prior abdominal surgeries.       Past Medical History:  Diagnosis Date   Allergic rhinitis    Asthma     Patient Active Problem List   Diagnosis Date Noted   Mild persistent asthma 07/04/2016   Allergic cough 07/04/2016   Seasonal and perennial allergic rhinoconjunctivitis 07/04/2016   Seasonal allergic conjunctivitis 07/04/2016    History reviewed. No pertinent surgical history.     Family History  Problem Relation Age of Onset   Allergic rhinitis Sister    Eczema Sister    Asthma Paternal Uncle    Urticaria Neg Hx    Immunodeficiency Neg Hx    Angioedema Neg Hx    Atopy Neg Hx     Social History   Tobacco Use   Smoking status: Every Day    Types: Cigarettes   Smokeless tobacco: Never   Tobacco comments:    half of one cig a day, black and mild  Vaping Use   Vaping Use: Never used  Substance Use Topics   Alcohol use: Yes   Drug use: Yes    Types: Marijuana    Home Medications Prior to Admission medications   Medication Sig Start Date End Date Taking? Authorizing Provider  ondansetron (ZOFRAN ODT) 4 MG disintegrating tablet Take 1 tablet (4 mg total) by mouth every 8 (eight) hours as needed for nausea or vomiting. 10/25/20  Yes Jeannie Fend, PA-C  albuterol (PROVENTIL HFA) 108 (90 Base) MCG/ACT inhaler Inhale 2 puffs into the lungs every 4 (four) hours as needed for  wheezing or shortness of breath. 07/04/16   Bobbitt, Heywood Iles, MD  benzonatate (TESSALON) 100 MG capsule Take 1 capsule (100 mg total) by mouth 3 (three) times daily as needed for cough. 06/24/20   Molpus, John, MD  cetirizine (ZYRTEC) 10 MG tablet Take 1 tablet (10 mg total) by mouth daily as needed for allergies. 07/04/16   Bobbitt, Heywood Iles, MD  EPINEPHrine (EPIPEN 2-PAK) 0.3 mg/0.3 mL IJ SOAJ injection Use as directed for severe allergic reactions 07/04/16   Bobbitt, Heywood Iles, MD  fluticasone [facility]) 50 MCG/ACT nasal spray Place 2 sprays into both nostrils daily as needed for allergies or rhinitis. 07/04/16   Bobbitt, Heywood Iles, MD  fluticasone (FLOVENT HFA) 110 MCG/ACT inhaler Inhale 2 puffs into the lungs 2 (two) times daily. Patient taking differently: Inhale 2 puffs into the lungs at bedtime. 07/04/16   Bobbitt, Heywood Iles, MD  metoCLOPramide (REGLAN) 10 MG tablet Take 1 tablet (10 mg total) by mouth every 6 (six) hours as needed for nausea or vomiting (nausea/headache). 06/24/20   Molpus, John, MD  NON FORMULARY 2 injections weekly    [provider]  olopatadine (PATANOL) 0.1 % ophthalmic solution Place 1 drop into both eyes 2 (two) times daily as needed for allergies. 07/04/16   Bobbitt, Heywood Iles, MD    Allergies    Patient  has no known allergies.  Review of Systems   Review of Systems  Constitutional:  Positive for appetite change. Negative for chills, diaphoresis and fever.  HENT:  Negative for congestion and sore throat.   Respiratory:  Positive for cough.   Gastrointestinal:  Positive for nausea and vomiting. Negative for abdominal pain, constipation and diarrhea.  Genitourinary:  Negative for difficulty urinating.  Musculoskeletal:  Negative for arthralgias and myalgias.  Skin:  Negative for rash and wound.  Allergic/Immunologic: Negative for immunocompromised state.  Neurological:  Negative for weakness.  Hematological:  Negative for adenopathy.   Psychiatric/Behavioral:  Negative for confusion.   All other systems reviewed and are negative.  Physical Exam Updated Vital Signs BP 126/76 (BP Location: Left Arm)   Pulse 61   Temp 97.9 F (36.6 C) (Oral)   Resp 16   Ht 5\' 9"  (1.753 m)   Wt 67 kg   SpO2 99%   BMI 21.81 kg/m   Physical Exam Vitals and nursing note reviewed.  Constitutional:      General: He is not in acute distress.    Appearance: He is well-developed. He is not diaphoretic.  HENT:     Head: Normocephalic and atraumatic.     Nose: Rhinorrhea present.     Mouth/Throat:     Mouth: Mucous membranes are moist.     Pharynx: Uvula midline. No pharyngeal swelling or posterior oropharyngeal erythema.  Cardiovascular:     Rate and Rhythm: Normal rate and regular rhythm.     Heart sounds: Normal heart sounds.  Pulmonary:     Effort: Pulmonary effort is normal.     Breath sounds: Normal breath sounds.  Abdominal:     Palpations: Abdomen is soft.     Tenderness: There is no abdominal tenderness.  Musculoskeletal:     Cervical back: Neck supple.  Skin:    General: Skin is warm and dry.     Findings: No erythema or rash.  Neurological:     Mental Status: He is alert and oriented to person, place, and time.  Psychiatric:        Behavior: Behavior normal.    ED Results / Procedures / Treatments   Labs (all labs ordered are listed, but only abnormal results are displayed) Labs Reviewed  RESP PANEL BY RT-PCR (FLU A&B, COVID) [id number]    EKG None  Radiology No results found.  Procedures Procedures   Medications Ordered in ED Medications  ondansetron (ZOFRAN-ODT) disintegrating tablet 4 mg (4 mg Oral Given 10/25/20 1924)    ED Course  I have reviewed the triage vital signs and the nursing notes.  Pertinent labs & imaging results that were available during my care of the patient were reviewed by me and considered in my medical decision making (see chart for details).  Clinical Course as of  10/25/20 2030  Tue Oct 25, 2020  [id number] 25 year old male presents with complaint of cough and vomiting onset today as above.  Denies abdominal pain or fevers.  Abdomen is soft and nontender.  Patient with frequent dry cough as well of rhinorrhea.  Patient was given Zofran, no longer vomiting and is feeling better.  He is COVID and flu negative.  Advised to manage symptoms at home with Zofran, Delsym or other OTC cough preparations.  Return precautions. [LM]    Clinical Course User Index [LM] 21   MDM Rules/Calculators/A&P  Final Clinical Impression(s) / ED Diagnoses Final diagnoses:  Nausea and vomiting, unspecified vomiting type  Viral URI with cough    Rx / DC Orders ED Discharge Orders          Ordered    ondansetron (ZOFRAN ODT) 4 MG disintegrating tablet  Every 8 hours PRN        10/25/20 2028             Jeannie Fend, PA-C 10/25/20 2031    Tegeler, Canary Brim, MD 10/26/20 [phone]

## 2020-10-25 NOTE — ED Triage Notes (Signed)
Vomiting today

## 2020-10-25 NOTE — ED Notes (Signed)
First contact with patient. Patient arrived via triage from home with complaints of coughing so much he vomits - Patient states he is not nauseated but he started coughing at 5am and now he is coughing so hard he is vomiting. Pt is A&OX 4. Respirations even/unlabored. Patient placed on monitor and call light within reach. Patient updated on plan of care. Will continue to monitor patient.
